# Patient Record
Sex: Female | Born: 1940 | Race: Black or African American | Hispanic: No
Health system: Southern US, Community
[De-identification: ages and names within clinical notes are randomized; demographics above are authoritative.]

---

## 2005-10-31 ENCOUNTER — Ambulatory Visit: Payer: Self-pay | Admitting: Cardiology

## 2005-11-07 ENCOUNTER — Ambulatory Visit: Payer: Self-pay | Admitting: Cardiology

## 2010-09-10 ENCOUNTER — Ambulatory Visit (INDEPENDENT_AMBULATORY_CARE_PROVIDER_SITE_OTHER): Payer: Medicare Other | Admitting: Gastroenterology

## 2010-09-10 DIAGNOSIS — R945 Abnormal results of liver function studies: Secondary | ICD-10-CM

## 2010-09-10 DIAGNOSIS — K766 Portal hypertension: Secondary | ICD-10-CM

## 2010-09-10 DIAGNOSIS — B199 Unspecified viral hepatitis without hepatic coma: Secondary | ICD-10-CM

## 2014-02-16 ENCOUNTER — Other Ambulatory Visit (HOSPITAL_COMMUNITY): Payer: Self-pay

## 2014-02-16 ENCOUNTER — Inpatient Hospital Stay
Admission: AD | Admit: 2014-02-16 | Discharge: 2014-03-09 | Disposition: A | Discharge status: Skilled Nursing Facility | Payer: Self-pay | Source: Ambulatory Visit | Attending: Internal Medicine | Admitting: Internal Medicine

## 2014-02-16 DIAGNOSIS — J189 Pneumonia, unspecified organism: Secondary | ICD-10-CM

## 2014-02-17 LAB — CBC WITH DIFFERENTIAL/PLATELET
Basophils Absolute: 0 10*3/uL (ref 0.0–0.1)
Basophils Relative: 0 % (ref 0–1)
EOS ABS: 0.1 10*3/uL (ref 0.0–0.7)
Eosinophils Relative: 1 % (ref 0–5)
HCT: 43.1 % (ref 36.0–46.0)
HEMOGLOBIN: 14 g/dL (ref 12.0–15.0)
Lymphocytes Relative: 7 % — ABNORMAL LOW (ref 12–46)
Lymphs Abs: 0.7 10*3/uL (ref 0.7–4.0)
MCH: 33.3 pg (ref 26.0–34.0)
MCHC: 32.5 g/dL (ref 30.0–36.0)
MCV: 102.4 fL — AB (ref 78.0–100.0)
Monocytes Absolute: 1.2 10*3/uL — ABNORMAL HIGH (ref 0.1–1.0)
Monocytes Relative: 11 % (ref 3–12)
NEUTROS ABS: 8.3 10*3/uL — AB (ref 1.7–7.7)
Neutrophils Relative %: 81 % — ABNORMAL HIGH (ref 43–77)
Platelets: 149 10*3/uL — ABNORMAL LOW (ref 150–400)
RBC: 4.21 MIL/uL (ref 3.87–5.11)
RDW: 15.5 % (ref 11.5–15.5)
WBC: 10.4 10*3/uL (ref 4.0–10.5)

## 2014-02-17 LAB — MAGNESIUM: Magnesium: 2.1 mg/dL (ref 1.5–2.5)

## 2014-02-17 LAB — PROCALCITONIN: PROCALCITONIN: 1.59 ng/mL

## 2014-02-17 LAB — VITAMIN B12: Vitamin B-12: 1585 pg/mL — ABNORMAL HIGH (ref 211–911)

## 2014-02-17 LAB — T4, FREE: FREE T4: 1.25 ng/dL (ref 0.80–1.80)

## 2014-02-17 LAB — COMPREHENSIVE METABOLIC PANEL
ALK PHOS: 350 U/L — AB (ref 39–117)
ALT: 26 U/L (ref 0–35)
ANION GAP: 14 (ref 5–15)
AST: 51 U/L — ABNORMAL HIGH (ref 0–37)
Albumin: 2.3 g/dL — ABNORMAL LOW (ref 3.5–5.2)
BUN: 26 mg/dL — ABNORMAL HIGH (ref 6–23)
CO2: 15 mmol/L — ABNORMAL LOW (ref 19–32)
Calcium: 9.1 mg/dL (ref 8.4–10.5)
Chloride: 120 mEq/L — ABNORMAL HIGH (ref 96–112)
Creatinine, Ser: 1.01 mg/dL (ref 0.50–1.10)
GFR calc non Af Amer: 53 mL/min — ABNORMAL LOW (ref >90)
GFR, EST AFRICAN AMERICAN: 62 mL/min — AB (ref >90)
Glucose, Bld: 134 mg/dL — ABNORMAL HIGH (ref 70–99)
POTASSIUM: 4.8 mmol/L (ref 3.5–5.1)
Sodium: 149 mmol/L — ABNORMAL HIGH (ref 135–145)
Total Bilirubin: 2.9 mg/dL — ABNORMAL HIGH (ref 0.3–1.2)
Total Protein: 5.8 g/dL — ABNORMAL LOW (ref 6.0–8.3)

## 2014-02-17 LAB — TSH: TSH: 3.095 u[IU]/mL (ref 0.350–4.500)

## 2014-02-17 LAB — PHOSPHORUS: Phosphorus: 3.5 mg/dL (ref 2.3–4.6)

## 2014-02-18 LAB — BASIC METABOLIC PANEL
ANION GAP: 7 (ref 5–15)
BUN: 21 mg/dL (ref 6–23)
CALCIUM: 8.4 mg/dL (ref 8.4–10.5)
CHLORIDE: 118 meq/L — AB (ref 96–112)
CO2: 20 mmol/L (ref 19–32)
Creatinine, Ser: 0.82 mg/dL (ref 0.50–1.10)
GFR, EST AFRICAN AMERICAN: 80 mL/min — AB (ref >90)
GFR, EST NON AFRICAN AMERICAN: 69 mL/min — AB (ref >90)
Glucose, Bld: 328 mg/dL — ABNORMAL HIGH (ref 70–99)
Potassium: 4.4 mmol/L (ref 3.5–5.1)
SODIUM: 145 mmol/L (ref 135–145)

## 2014-02-20 LAB — BASIC METABOLIC PANEL
Anion gap: 5 (ref 5–15)
BUN: 15 mg/dL (ref 6–23)
CHLORIDE: 116 meq/L — AB (ref 96–112)
CO2: 16 mmol/L — ABNORMAL LOW (ref 19–32)
Calcium: 8 mg/dL — ABNORMAL LOW (ref 8.4–10.5)
Creatinine, Ser: 0.58 mg/dL (ref 0.50–1.10)
GFR, EST NON AFRICAN AMERICAN: 89 mL/min — AB (ref >90)
Glucose, Bld: 198 mg/dL — ABNORMAL HIGH (ref 70–99)
Potassium: 5.5 mmol/L — ABNORMAL HIGH (ref 3.5–5.1)
SODIUM: 137 mmol/L (ref 135–145)

## 2014-02-20 LAB — FOLATE RBC: RBC FOLATE: 842 ng/mL — AB (ref >280)

## 2014-02-21 LAB — POTASSIUM: Potassium: 5 mmol/L (ref 3.5–5.1)

## 2014-02-21 LAB — BASIC METABOLIC PANEL
Anion gap: 5 (ref 5–15)
BUN: 12 mg/dL (ref 6–23)
CALCIUM: 9.2 mg/dL (ref 8.4–10.5)
CO2: 20 mmol/L (ref 19–32)
Chloride: 118 mEq/L — ABNORMAL HIGH (ref 96–112)
Creatinine, Ser: 0.65 mg/dL (ref 0.50–1.10)
GFR calc Af Amer: >90 mL/min (ref >90)
GFR calc non Af Amer: 85 mL/min — ABNORMAL LOW (ref >90)
GLUCOSE: 236 mg/dL — AB (ref 70–99)
Potassium: 4.9 mmol/L (ref 3.5–5.1)
SODIUM: 143 mmol/L (ref 135–145)

## 2014-02-23 ENCOUNTER — Other Ambulatory Visit (HOSPITAL_COMMUNITY): Payer: Self-pay

## 2014-02-23 LAB — CBC WITH DIFFERENTIAL/PLATELET
BASOS ABS: 0 10*3/uL (ref 0.0–0.1)
Basophils Relative: 1 % (ref 0–1)
EOS ABS: 0.2 10*3/uL (ref 0.0–0.7)
Eosinophils Relative: 4 % (ref 0–5)
HEMATOCRIT: 37.9 % (ref 36.0–46.0)
Hemoglobin: 12.7 g/dL (ref 12.0–15.0)
LYMPHS ABS: 1.1 10*3/uL (ref 0.7–4.0)
Lymphocytes Relative: 17 % (ref 12–46)
MCH: 34.3 pg — ABNORMAL HIGH (ref 26.0–34.0)
MCHC: 33.5 g/dL (ref 30.0–36.0)
MCV: 102.4 fL — AB (ref 78.0–100.0)
MONO ABS: 0.6 10*3/uL (ref 0.1–1.0)
MONOS PCT: 10 % (ref 3–12)
Neutro Abs: 4.4 10*3/uL (ref 1.7–7.7)
Neutrophils Relative %: 70 % (ref 43–77)
PLATELETS: 120 10*3/uL — AB (ref 150–400)
RBC: 3.7 MIL/uL — AB (ref 3.87–5.11)
RDW: 14.9 % (ref 11.5–15.5)
WBC: 6.3 10*3/uL (ref 4.0–10.5)

## 2014-02-23 LAB — BASIC METABOLIC PANEL
Anion gap: 4 — ABNORMAL LOW (ref 5–15)
BUN: 11 mg/dL (ref 6–23)
CHLORIDE: 118 meq/L — AB (ref 96–112)
CO2: 23 mmol/L (ref 19–32)
CREATININE: 0.61 mg/dL (ref 0.50–1.10)
Calcium: 9.2 mg/dL (ref 8.4–10.5)
GFR calc non Af Amer: 87 mL/min — ABNORMAL LOW (ref >90)
Glucose, Bld: 161 mg/dL — ABNORMAL HIGH (ref 70–99)
Potassium: 4.4 mmol/L (ref 3.5–5.1)
Sodium: 145 mmol/L (ref 135–145)

## 2014-02-23 LAB — HEMOGLOBIN A1C
HEMOGLOBIN A1C: 6.4 % — AB (ref <5.7)
Mean Plasma Glucose: 137 mg/dL — ABNORMAL HIGH (ref <117)

## 2014-02-23 LAB — MAGNESIUM: Magnesium: 1.5 mg/dL (ref 1.5–2.5)

## 2014-02-23 LAB — PHOSPHORUS: PHOSPHORUS: 2.8 mg/dL (ref 2.3–4.6)

## 2014-02-27 LAB — CLOSTRIDIUM DIFFICILE BY PCR: CDIFFPCR: NEGATIVE

## 2014-02-28 LAB — CBC
HEMATOCRIT: 38.5 % (ref 36.0–46.0)
HEMOGLOBIN: 12.6 g/dL (ref 12.0–15.0)
MCH: 33 pg (ref 26.0–34.0)
MCHC: 32.7 g/dL (ref 30.0–36.0)
MCV: 100.8 fL — AB (ref 78.0–100.0)
Platelets: 173 10*3/uL (ref 150–400)
RBC: 3.82 MIL/uL — AB (ref 3.87–5.11)
RDW: 14.5 % (ref 11.5–15.5)
WBC: 4.3 10*3/uL (ref 4.0–10.5)

## 2014-02-28 LAB — URINALYSIS, ROUTINE W REFLEX MICROSCOPIC
Bilirubin Urine: NEGATIVE
Glucose, UA: NEGATIVE mg/dL
Ketones, ur: NEGATIVE mg/dL
Nitrite: NEGATIVE
PH: 5 (ref 5.0–8.0)
PROTEIN: NEGATIVE mg/dL
Specific Gravity, Urine: 1.022 (ref 1.005–1.030)
Urobilinogen, UA: 0.2 mg/dL (ref 0.0–1.0)

## 2014-02-28 LAB — BASIC METABOLIC PANEL
ANION GAP: 8 (ref 5–15)
BUN: 14 mg/dL (ref 6–23)
CALCIUM: 10.2 mg/dL (ref 8.4–10.5)
CO2: 20 mmol/L (ref 19–32)
Chloride: 113 mEq/L — ABNORMAL HIGH (ref 96–112)
Creatinine, Ser: 0.98 mg/dL (ref 0.50–1.10)
GFR calc Af Amer: 64 mL/min — ABNORMAL LOW (ref >90)
GFR calc non Af Amer: 55 mL/min — ABNORMAL LOW (ref >90)
Glucose, Bld: 137 mg/dL — ABNORMAL HIGH (ref 70–99)
Potassium: 4.3 mmol/L (ref 3.5–5.1)
Sodium: 141 mmol/L (ref 135–145)

## 2014-02-28 LAB — URINE MICROSCOPIC-ADD ON

## 2014-03-01 LAB — URINE CULTURE: Special Requests: NORMAL

## 2014-03-01 LAB — CLOSTRIDIUM DIFFICILE BY PCR: Toxigenic C. Difficile by PCR: NEGATIVE

## 2014-03-03 ENCOUNTER — Other Ambulatory Visit (HOSPITAL_COMMUNITY): Payer: Medicare Other

## 2014-03-04 LAB — URINE MICROSCOPIC-ADD ON

## 2014-03-04 LAB — CBC WITH DIFFERENTIAL/PLATELET
BASOS ABS: 0 10*3/uL (ref 0.0–0.1)
Basophils Relative: 1 % (ref 0–1)
EOS PCT: 5 % (ref 0–5)
Eosinophils Absolute: 0.2 10*3/uL (ref 0.0–0.7)
HCT: 36.9 % (ref 36.0–46.0)
HEMOGLOBIN: 12.4 g/dL (ref 12.0–15.0)
LYMPHS PCT: 22 % (ref 12–46)
Lymphs Abs: 1.1 10*3/uL (ref 0.7–4.0)
MCH: 33.2 pg (ref 26.0–34.0)
MCHC: 33.6 g/dL (ref 30.0–36.0)
MCV: 98.9 fL (ref 78.0–100.0)
MONO ABS: 0.5 10*3/uL (ref 0.1–1.0)
MONOS PCT: 10 % (ref 3–12)
Neutro Abs: 3.1 10*3/uL (ref 1.7–7.7)
Neutrophils Relative %: 62 % (ref 43–77)
Platelets: 179 10*3/uL (ref 150–400)
RBC: 3.73 MIL/uL — ABNORMAL LOW (ref 3.87–5.11)
RDW: 14 % (ref 11.5–15.5)
WBC: 5 10*3/uL (ref 4.0–10.5)

## 2014-03-04 LAB — URINALYSIS, ROUTINE W REFLEX MICROSCOPIC
Bilirubin Urine: NEGATIVE
GLUCOSE, UA: NEGATIVE mg/dL
KETONES UR: 15 mg/dL — AB
NITRITE: NEGATIVE
PROTEIN: NEGATIVE mg/dL
Specific Gravity, Urine: 1.022 (ref 1.005–1.030)
Urobilinogen, UA: 0.2 mg/dL (ref 0.0–1.0)
pH: 5 (ref 5.0–8.0)

## 2014-03-04 LAB — BASIC METABOLIC PANEL
Anion gap: 8 (ref 5–15)
BUN: 14 mg/dL (ref 6–23)
CO2: 25 mmol/L (ref 19–32)
Calcium: 10.8 mg/dL — ABNORMAL HIGH (ref 8.4–10.5)
Chloride: 108 mEq/L (ref 96–112)
Creatinine, Ser: 0.91 mg/dL (ref 0.50–1.10)
GFR calc non Af Amer: 61 mL/min — ABNORMAL LOW (ref >90)
GFR, EST AFRICAN AMERICAN: 70 mL/min — AB (ref >90)
Glucose, Bld: 132 mg/dL — ABNORMAL HIGH (ref 70–99)
Potassium: 4.4 mmol/L (ref 3.5–5.1)
SODIUM: 141 mmol/L (ref 135–145)

## 2014-03-04 LAB — MAGNESIUM: MAGNESIUM: 1.4 mg/dL — AB (ref 1.5–2.5)

## 2014-03-04 LAB — PHOSPHORUS: Phosphorus: 3.1 mg/dL (ref 2.3–4.6)

## 2014-03-07 LAB — CBC WITH DIFFERENTIAL/PLATELET
Basophils Absolute: 0 10*3/uL (ref 0.0–0.1)
Basophils Relative: 1 % (ref 0–1)
EOS PCT: 3 % (ref 0–5)
Eosinophils Absolute: 0.2 10*3/uL (ref 0.0–0.7)
HCT: 38.1 % (ref 36.0–46.0)
Hemoglobin: 12.8 g/dL (ref 12.0–15.0)
LYMPHS PCT: 22 % (ref 12–46)
Lymphs Abs: 1.2 10*3/uL (ref 0.7–4.0)
MCH: 32.9 pg (ref 26.0–34.0)
MCHC: 33.6 g/dL (ref 30.0–36.0)
MCV: 97.9 fL (ref 78.0–100.0)
Monocytes Absolute: 0.6 10*3/uL (ref 0.1–1.0)
Monocytes Relative: 12 % (ref 3–12)
NEUTROS PCT: 62 % (ref 43–77)
Neutro Abs: 3.4 10*3/uL (ref 1.7–7.7)
PLATELETS: 170 10*3/uL (ref 150–400)
RBC: 3.89 MIL/uL (ref 3.87–5.11)
RDW: 13.9 % (ref 11.5–15.5)
WBC: 5.4 10*3/uL (ref 4.0–10.5)

## 2014-03-07 LAB — COMPREHENSIVE METABOLIC PANEL
ALT: 21 U/L (ref 0–35)
ANION GAP: 8 (ref 5–15)
AST: 54 U/L — AB (ref 0–37)
Albumin: 2.1 g/dL — ABNORMAL LOW (ref 3.5–5.2)
Alkaline Phosphatase: 399 U/L — ABNORMAL HIGH (ref 39–117)
BILIRUBIN TOTAL: 1.5 mg/dL — AB (ref 0.3–1.2)
BUN: 13 mg/dL (ref 6–23)
CHLORIDE: 106 meq/L (ref 96–112)
CO2: 25 mmol/L (ref 19–32)
Calcium: 10.7 mg/dL — ABNORMAL HIGH (ref 8.4–10.5)
Creatinine, Ser: 1.13 mg/dL — ABNORMAL HIGH (ref 0.50–1.10)
GFR calc Af Amer: 54 mL/min — ABNORMAL LOW (ref >90)
GFR calc non Af Amer: 47 mL/min — ABNORMAL LOW (ref >90)
GLUCOSE: 217 mg/dL — AB (ref 70–99)
Potassium: 4.7 mmol/L (ref 3.5–5.1)
SODIUM: 139 mmol/L (ref 135–145)
Total Protein: 6 g/dL (ref 6.0–8.3)

## 2014-03-07 LAB — PHOSPHORUS: PHOSPHORUS: 2.9 mg/dL (ref 2.3–4.6)

## 2014-03-07 LAB — MAGNESIUM: MAGNESIUM: 1.6 mg/dL (ref 1.5–2.5)

## 2014-03-08 LAB — URINE CULTURE: Colony Count: 50000

## 2014-03-09 LAB — BASIC METABOLIC PANEL
ANION GAP: 11 (ref 5–15)
BUN: 15 mg/dL (ref 6–23)
CHLORIDE: 107 meq/L (ref 96–112)
CO2: 24 mmol/L (ref 19–32)
CREATININE: 1.06 mg/dL (ref 0.50–1.10)
Calcium: 11.1 mg/dL — ABNORMAL HIGH (ref 8.4–10.5)
GFR calc Af Amer: 58 mL/min — ABNORMAL LOW (ref >90)
GFR calc non Af Amer: 50 mL/min — ABNORMAL LOW (ref >90)
GLUCOSE: 103 mg/dL — AB (ref 70–99)
Potassium: 5.1 mmol/L (ref 3.5–5.1)
Sodium: 142 mmol/L (ref 135–145)

## 2014-03-09 LAB — MAGNESIUM: Magnesium: 1.6 mg/dL (ref 1.5–2.5)

## 2014-03-09 LAB — PHOSPHORUS: PHOSPHORUS: 3.7 mg/dL (ref 2.3–4.6)

## 2014-05-20 ENCOUNTER — Ambulatory Visit (HOSPITAL_COMMUNITY)
Admission: RE | Admit: 2014-05-20 | Discharge: 2014-05-20 | Disposition: A | Discharge status: Home / Self Care | Payer: Medicare Other | Source: Ambulatory Visit | Attending: Internal Medicine | Admitting: Internal Medicine

## 2014-05-20 DIAGNOSIS — R4182 Altered mental status, unspecified: Secondary | ICD-10-CM

## 2014-05-20 NOTE — Procedures (Signed)
ELECTROENCEPHALOGRAM REPORT  Date of Study: 05/20/2014  Patient's Name: Shannon Marquez MRN: [id number] Date of Birth: [birth year]  Referring Provider: Dr. 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  Medications: Keppra, Seroquel, Prednisone, Metoprolol, Lactulose, Amlodipine, aspirin  Technical Summary: A multichannel digital EEG recording measured by the international 10-20 system with electrodes applied with paste and impedances below 5000 ohms performed as portable with EKG monitoring in an unresponsive patient.  Hyperventilation and photic stimulation were not performed.  The digital EEG was referentially recorded, reformatted, and digitally filtered in a variety of bipolar and referential montages for optimal display.   Description: The patient is unreponsive during the recording.  There is no clear posterior dominant rhythm. The background consists of a moderate amount of diffuse 5-6 Hz theta and 2-3 Hz delta slowing. Normal sleep architecture is not seen. Throughout the recording, there are lateralized periodic discharges seen over the right hemisphere at a frequency of every 0.5 to 1 second without evolution in frequency or amplitude. There were no electrographic seizures seen.   EKG lead was unremarkable.  Impression: This EEG is abnormal due to the presence of: 1. Moderate diffuse slowing of the background 2. Lateralized periodic discharges over the right hemisphere  Clinical Correlation: Diffuse background slowing diffuse cerebral dysfunction that is non-specific in etiology. Although lateralized periodic discharges do not represent ongoing seizure activity, they are commonly associated with an acute brain lesion and clinical focal seizures, or post-ictal after focal status epilepticus. There were no electrographic seizures in this  study.    Patrcia DollyKaren Aquino, M.D.

## 2014-05-20 NOTE — Progress Notes (Signed)
[facility] results [facility].

## 2014-08-19 DEATH — deceased

## 2016-05-05 IMAGING — CR DG CHEST 1V PORT
1 series · 1 of 1 positions shown · non-contrast
Comparison: February 16, 2014.

CLINICAL DATA: Aspiration pneumonia.

EXAM:
PORTABLE CHEST - 1 VIEW

[AP]
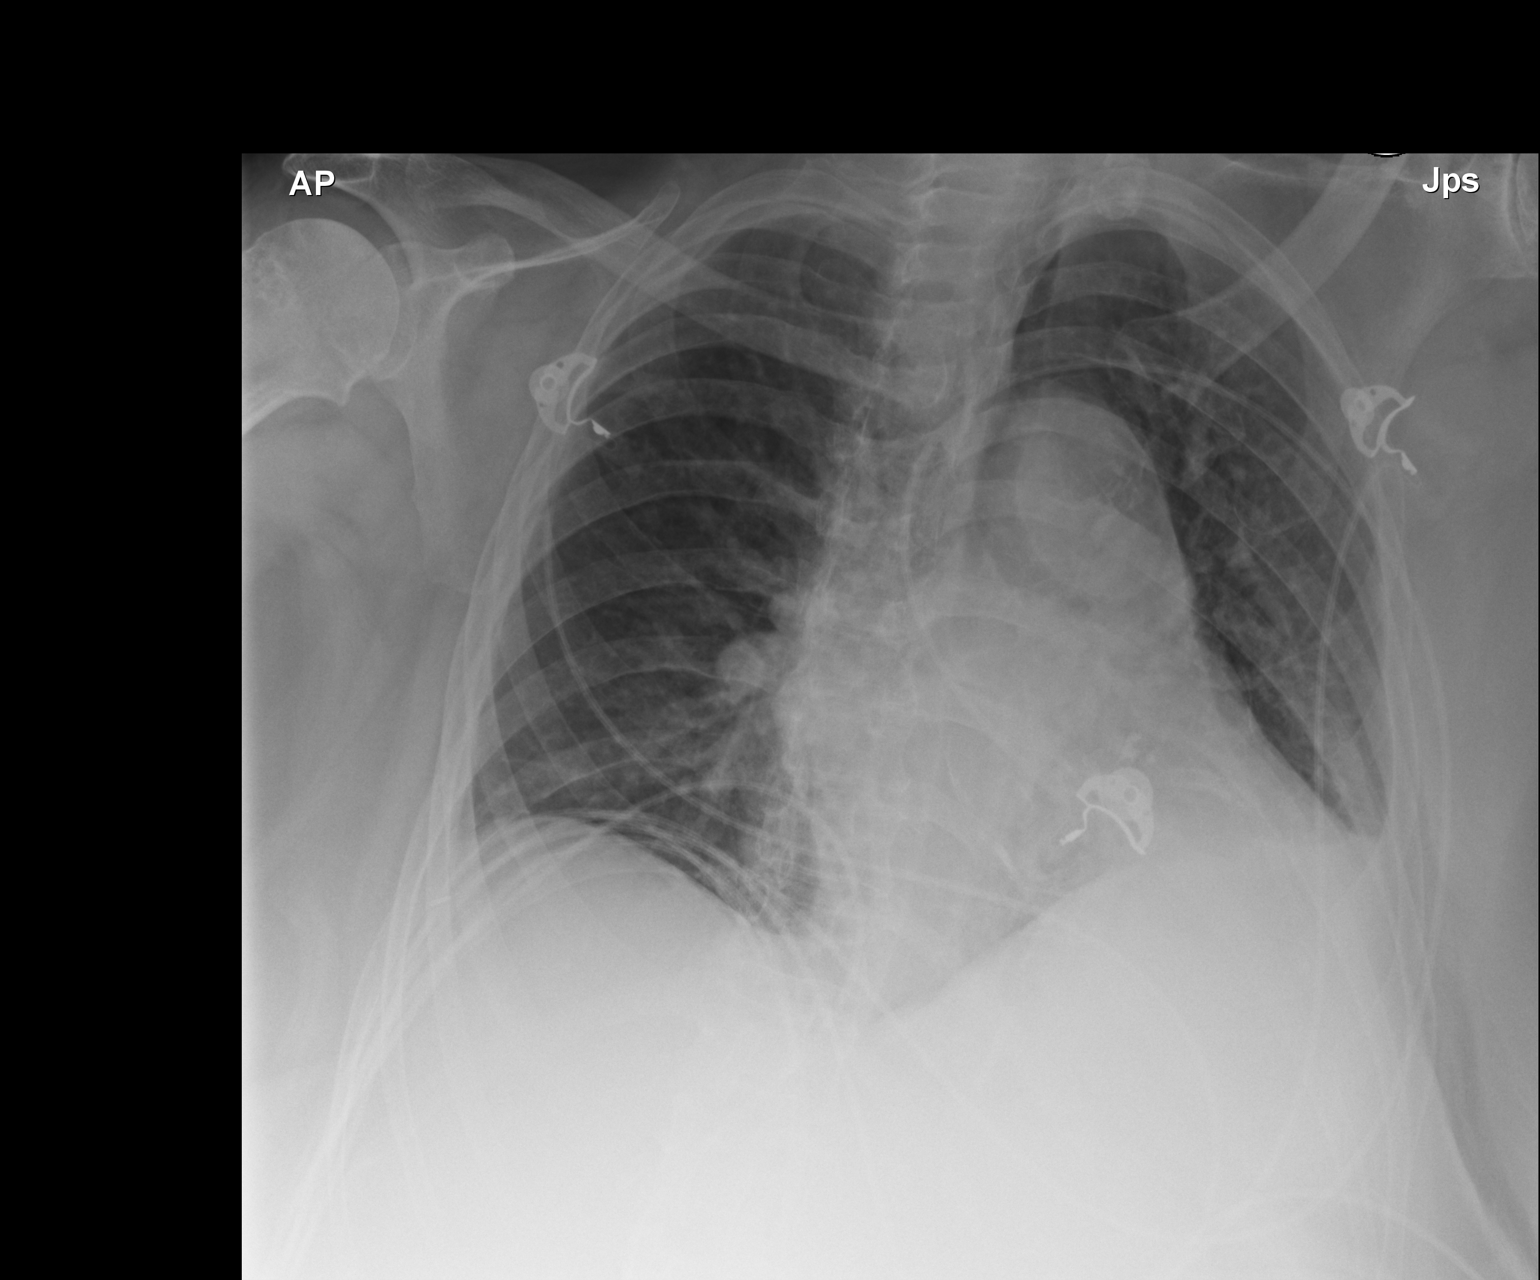

[1 of 1 positions shown; findings below may reference images not displayed]

FINDINGS: The heart size and mediastinal contours are within normal limits.
Both lungs are clear. No pneumothorax is noted. Stable small left
pleural effusion is noted. The visualized skeletal structures are
unremarkable.
IMPRESSION: Stable small left pleural effusion. No significant change compared
to prior exam.
